# Patient Record
Sex: Female | Born: 1935 | Race: White | Hispanic: No | Smoking: Never smoker
Health system: Southern US, Community
[De-identification: ages and names within clinical notes are randomized; demographics above are authoritative.]

## PROBLEM LIST (undated history)

## (undated) DIAGNOSIS — S82143A Displaced bicondylar fracture of unspecified tibia, initial encounter for closed fracture: Secondary | ICD-10-CM

## (undated) DIAGNOSIS — I639 Cerebral infarction, unspecified: Secondary | ICD-10-CM

## (undated) DIAGNOSIS — I10 Essential (primary) hypertension: Secondary | ICD-10-CM

## (undated) DIAGNOSIS — R609 Edema, unspecified: Secondary | ICD-10-CM

## (undated) DIAGNOSIS — J45909 Unspecified asthma, uncomplicated: Secondary | ICD-10-CM

## (undated) DIAGNOSIS — I739 Peripheral vascular disease, unspecified: Secondary | ICD-10-CM

## (undated) DIAGNOSIS — I509 Heart failure, unspecified: Secondary | ICD-10-CM

## (undated) DIAGNOSIS — N189 Chronic kidney disease, unspecified: Secondary | ICD-10-CM

## (undated) HISTORY — PX: APPENDECTOMY: SHX54

## (undated) HISTORY — PX: FOOT SURGERY: SHX648

## (undated) HISTORY — PX: SHOULDER SURGERY: SHX246

---

## 2005-12-04 ENCOUNTER — Ambulatory Visit: Payer: Self-pay | Admitting: Internal Medicine

## 2006-12-07 ENCOUNTER — Ambulatory Visit: Payer: Self-pay | Admitting: Nurse Practitioner

## 2008-02-07 ENCOUNTER — Ambulatory Visit: Payer: Self-pay | Admitting: Internal Medicine

## 2008-10-10 ENCOUNTER — Inpatient Hospital Stay: Payer: Self-pay | Admitting: Vascular Surgery

## 2008-10-10 ENCOUNTER — Ambulatory Visit: Payer: Self-pay | Admitting: Internal Medicine

## 2009-07-16 ENCOUNTER — Ambulatory Visit: Payer: Self-pay | Admitting: Internal Medicine

## 2011-07-18 ENCOUNTER — Ambulatory Visit: Payer: Self-pay | Admitting: Internal Medicine

## 2013-01-26 ENCOUNTER — Ambulatory Visit: Payer: Self-pay | Admitting: Internal Medicine

## 2014-01-13 ENCOUNTER — Ambulatory Visit: Payer: Self-pay | Admitting: Family Medicine

## 2017-01-18 ENCOUNTER — Ambulatory Visit
Admission: EM | Admit: 2017-01-18 | Discharge: 2017-01-18 | Disposition: A | Discharge status: Home / Self Care | Payer: Medicare HMO | Attending: Family Medicine | Admitting: Family Medicine

## 2017-01-18 ENCOUNTER — Encounter: Payer: Self-pay | Admitting: Emergency Medicine

## 2017-01-18 DIAGNOSIS — T50905A Adverse effect of unspecified drugs, medicaments and biological substances, initial encounter: Secondary | ICD-10-CM

## 2017-01-18 DIAGNOSIS — L509 Urticaria, unspecified: Secondary | ICD-10-CM

## 2017-01-18 HISTORY — DX: Displaced bicondylar fracture of unspecified tibia, initial encounter for closed fracture: S82.143A

## 2017-01-18 HISTORY — DX: Peripheral vascular disease, unspecified: I73.9

## 2017-01-18 HISTORY — DX: Cerebral infarction, unspecified: I63.9

## 2017-01-18 HISTORY — DX: Heart failure, unspecified: I50.9

## 2017-01-18 HISTORY — DX: Unspecified asthma, uncomplicated: J45.909

## 2017-01-18 HISTORY — DX: Chronic kidney disease, unspecified: N18.9

## 2017-01-18 HISTORY — DX: Essential (primary) hypertension: I10

## 2017-01-18 HISTORY — DX: Edema, unspecified: R60.9

## 2017-01-18 MED ORDER — PREDNISONE 20 MG PO TABS
40.0000 mg | ORAL_TABLET | Freq: Once | ORAL | Status: AC
  Administered 2017-01-18: 40 mg via ORAL

## 2017-01-18 MED ORDER — PREDNISONE 20 MG PO TABS: ORAL_TABLET | ORAL | 0 refills | Status: DC

## 2017-01-18 MED ORDER — FAMOTIDINE 20 MG PO TABS: 20.0000 mg | ORAL_TABLET | Freq: Two times a day (BID) | ORAL | 0 refills | Status: DC

## 2017-01-18 NOTE — Discharge Instructions (Signed)
Take medication as prescribed. Rest. Drink plenty of fluids. Monitor closely.   Follow up with your primary care physician for follow up. Return to Urgent care as needed. For any lip, tongue or oral swelling, shortness of breath, chest discomfort, or worsening complaints proceed directly to the Emergency room.

## 2017-01-18 NOTE — ED Triage Notes (Addendum)
Per patient was seen at her doctor for an ear infection on 01/05/2017. Patient stated was given Rx. Amoxicillin 875 mg , Prednisolone , and Flonase. Patient started to have a reaction to the amoxicillin after taking she notice  rash all over her body. Per patient stop taking and went back to see her doctor x 3 days ago (01/15/17) and was given benadryl 25 mg. Patient stated rash getting worse.

## 2017-01-18 NOTE — ED Provider Notes (Signed)
[facility] ____________________________________________  Time seen: Approximately 2:46 PM  I have reviewed the triage vital signs and the nursing notes.   HISTORY  Chief Complaint Medication Reaction  HPI Heather Ellison is a 83 y.o. female presenting for evaluation of itchy rash that is all over. Patient states she was recently treated with amoxicillin, Flonase and methylprednisone for sinus congestion and right otitis media. Patient states that she completed the methylprednisone without any issues. Patient states that at 7 or 8 days into the antibiotic she began to have it itchy rash all over. Patient states the rash started with one red mark on her stomach that quickly spread elsewhere. States that she is still having a few new areas, up, but states overall the rash isn't going away. States rash is very itchy. States that she was seen by her doctor's office this past Thursday and was told to take Benadryl. States she's been taking Benadryl that helps with the itching the rash is still present. Denies any other changes in foods, medicines, lotions, detergents or other contacts. States that she has taken steroid medication as well as Flonase in the past without any complications. Denies previous known amoxicillin allergy. Denies any lip, tongue, oral swelling, shortness of breath, difficulty swallowing or chest discomfort. Reports his continued to eat and drink well. Reports is continue to remain active. Denies any insect bites, tick bite or tick attachment. Denies any other known trigger. States has not been trying any other over-the-counter medications for the same complaints. States taken Benadryl 1 pill 3-4 times a day. Denies any other known triggers. Denies any other aggravating or alleviating factors. States has stopped the amoxicillin and Flonase since rash onset.  Denies chest pain, shortness of breath, abdominal pain, dysuria, fatigue, extremity pain, extremity swelling. Denies  recent sickness. Denies recent antibiotic use.  Denies cardiac history. Denies renal insufficiency.  Verner Mould, MD: PCP   medical history on file HTN  There are no active problems to display for this patient.   Past Surgical History:  Procedure Laterality Date  . APPENDECTOMY    . FOOT SURGERY    . SHOULDER SURGERY       No current facility-administered medications for this encounter.   Current Outpatient Prescriptions:  .  amLODipine (NORVASC) 5 MG tablet, Take 5 mg by mouth daily., Disp: , Rfl:  .  Cholecalciferol 2000 units CAPS, Take by mouth., Disp: , Rfl:  .  fluticasone (FLONASE) 50 MCG/ACT nasal spray, Place into both nostrils daily., Disp: , Rfl:  .  gabapentin (NEURONTIN) 300 MG capsule, Take 300 mg by mouth 3 (three) times daily., Disp: , Rfl:  .  lisinopril (PRINIVIL,ZESTRIL) 40 MG tablet, Take 40 mg by mouth daily., Disp: , Rfl:  .  metoprolol succinate (TOPROL-XL) 50 MG 24 hr tablet, Take 50 mg by mouth daily. Take with or immediately following a meal., Disp: , Rfl:  .  torsemide (DEMADEX) 20 MG tablet, Take 20 mg by mouth daily., Disp: , Rfl:  .  vitamin B-12 (CYANOCOBALAMIN) 1000 MCG tablet, Take 1,000 mcg by mouth daily., Disp: , Rfl:  .  famotidine (PEPCID) 20 MG tablet, Take 1 tablet (20 mg total) by mouth 2 (two) times daily., Disp: 10 tablet, Rfl: 0 .  predniSONE (DELTASONE) 20 MG tablet, Take 40 mg orally for 2 days, then 20 mg orally for 2 days, then 10 mg orally for 2 days., Disp: 7 tablet, Rfl: 0  Allergies Amoxicillin  No family history on file.  Social History Social History  Substance Use Topics  . Smoking status: Former Games developer  . Smokeless tobacco: Never Used  . Alcohol use No    Review of Systems Constitutional: No fever/chills Eyes: No visual changes. ENT: No sore throat. Cardiovascular: Denies chest pain. Respiratory: Denies shortness of breath. Gastrointestinal: No abdominal pain.  No nausea, no vomiting.  Musculoskeletal:  Negative for back pain. Skin: positive for rash. Neurological: Negative for headaches, focal weakness or numbness.   ____________________________________________   PHYSICAL EXAM:  VITAL SIGNS: ED Triage Vitals  Enc Vitals Group     BP 01/18/17 1439 (!) 138/50     Pulse Rate 01/18/17 1439 83     Resp 01/18/17 1439 16     Temp 01/18/17 1439 98.1 F (36.7 C)     Temp Source 01/18/17 1439 Oral     SpO2 01/18/17 1439 98 %     Weight 01/18/17 1442 215 lb (97.5 kg)     Height --      Head Circumference --      Peak Flow --      Pain Score 01/18/17 1443 0     Pain Loc --      Pain Edu? --      Excl. in GC? --     Constitutional: Alert and oriented. Well appearing and in no acute distress. ENT      Head: Normocephalic and atraumatic.      Mouth/Throat: Mucous membranes are moist.Oropharynx non-erythematous. No lip, tongue or oral swelling noted. Speech clear.  Neck: No stridor. Supple without meningismus.  Hematological/Lymphatic/Immunilogical: No cervical lymphadenopathy. Cardiovascular: Normal rate, regular rhythm. Grossly normal heart sounds.  Good peripheral circulation. Respiratory: Normal respiratory effort without tachypnea nor retractions. Breath sounds are clear and equal bilaterally. No wheezes, rales, rhonchi. Gastrointestinal: Soft and nontender.  Musculoskeletal. No midline cervical, thoracic or lumbar tenderness to palpation.       Right lower leg:  No tenderness or edema.      Left lower leg:  No tenderness or edema.  Neurologic:  Normal speech and language. No gross focal neurologic deficits are appreciated. Speech is normal.  Skin:  Skin is warm, dry and intact.except: Diffuse urticaria, nontender, no drainage, skin intact.  Psychiatric: Mood and affect are normal. Speech and behavior are normal. Patient exhibits appropriate insight and judgment   ___________________________________________   LABS (all labs ordered are listed, but only abnormal results are  displayed)  Labs Reviewed - No data to display ____________________________________________   PROCEDURES Procedures     INITIAL IMPRESSION / ASSESSMENT AND PLAN / ED COURSE  Pertinent labs & imaging results that were available during my care of the patient were reviewed by me and considered in my medical decision making (see chart for details).  Well-appearing patient. No acute distress. Diffuse urticaria. Discussed with patient, suspect medication reaction from amoxicillin. Patient states Benadryl is not resolving the rash that she has been taking over the last 2 days. Denies cardiac history or renal insufficiency. States not diabetic. Will treat patient with oral prednisone taper, Pepcid and continue home Benadryl. Discussed very strict follow-up and return parameters, including proceeding to emergency room for any lip or oral swelling, difficulty swallowing, shortness of breath or chest discomfort. Encourage close PCP follow-up, follow-up this week. Patient states that her pharmacy is closed today, 40 mg oral prednisone given once in urgent care. Avoid trigger. Discussed indication, risks and benefits of medications with patient.  Discussed follow up with Primary care physician this week.  Discussed follow up and return parameters including no resolution or any worsening concerns. Patient verbalized understanding and agreed to plan.   ____________________________________________   FINAL CLINICAL IMPRESSION(S) / ED DIAGNOSES  Final diagnoses:  Urticarial rash  Medication reaction, initial encounter     Discharge Medication List as of 01/18/2017  3:11 PM    START taking these medications   Details  famotidine (PEPCID) 20 MG tablet Take 1 tablet (20 mg total) by mouth 2 (two) times daily., Starting Sun 01/18/2017, Until Fri 01/23/2017, Normal    predniSONE (DELTASONE) 20 MG tablet Take 40 mg orally for 2 days, then 20 mg orally for 2 days, then 10 mg orally for 2 days., Normal          Note: This dictation was prepared with Dragon dictation along with smaller phrase technology. Any transcriptional errors that result from this process are unintentional.         Renford Dills, NP 01/18/17 [phone]

## 2018-05-04 ENCOUNTER — Other Ambulatory Visit: Payer: Self-pay

## 2018-05-04 ENCOUNTER — Ambulatory Visit
Admission: EM | Admit: 2018-05-04 | Discharge: 2018-05-04 | Disposition: A | Discharge status: Home / Self Care | Payer: Medicare HMO | Attending: Family Medicine | Admitting: Family Medicine

## 2018-05-04 ENCOUNTER — Ambulatory Visit (INDEPENDENT_AMBULATORY_CARE_PROVIDER_SITE_OTHER): Payer: Medicare HMO

## 2018-05-04 ENCOUNTER — Encounter: Payer: Self-pay | Admitting: Emergency Medicine

## 2018-05-04 DIAGNOSIS — I10 Essential (primary) hypertension: Secondary | ICD-10-CM | POA: Diagnosis not present

## 2018-05-04 DIAGNOSIS — L84 Corns and callosities: Secondary | ICD-10-CM | POA: Insufficient documentation

## 2018-05-04 DIAGNOSIS — M79672 Pain in left foot: Secondary | ICD-10-CM | POA: Diagnosis not present

## 2018-05-04 NOTE — ED Triage Notes (Signed)
Patient in today c/o left foot pain around the arch x 1 month. No injury noted.

## 2018-05-04 NOTE — ED Triage Notes (Signed)
Appointment made with Dr. Ether [place] for 05/10/18 @1 :15pm.

## 2018-05-04 NOTE — Discharge Instructions (Signed)
Use postoperative shoe.  Follow-up with podiatry.  Return to urgent care as needed.

## 2018-05-04 NOTE — ED Provider Notes (Signed)
[facility] ____________________________________________  Time seen: Approximately 11:48 AM  I have reviewed the triage vital signs and the nursing notes.   HISTORY  Chief Complaint Foot Pain (left)   HPI Heather Ellison is Ellison 84 y.o. female seen for evaluation of left foot pain to the arch of her foot present for approximately 1 month.  Denies any fall, injury or known trigger.  Patient does report however she has been very busy over the last few months as her sons have been in hospitals.  States that she has had increased walking compared to normal.  Denies abrupt onset.  States that she does not believe her shoes are rubbing this area.  Denies any redness or drainage.  No fevers.  Denies any pain radiation, paresthesias or other pain.  Reports otherwise doing well denies other complaints.  No chest pain shortness of breath.  No alleviating measures attempted.  States pain is worse with direct palpation.  Currently mild pain.  Verner MouldFowler, Heather Ellison, Heather Ellison: PCP    Past Medical History:  Diagnosis Date  . Asthma   . CKD (chronic kidney disease)   . Heart failure (HCC)   . Hypertension   . Peripheral arterial disease (HCC)   . Peripheral edema   . Stroke (HCC)   . Tibial plateau fracture     There are no active problems to display for this patient.   Past Surgical History:  Procedure Laterality Date  . APPENDECTOMY    . FOOT SURGERY    . SHOULDER SURGERY       No current facility-administered medications for this encounter.   Current Outpatient Medications:  .  amLODipine (NORVASC) 5 MG tablet, Take 5 mg by mouth daily., Disp: , Rfl:  .  lisinopril (PRINIVIL,ZESTRIL) 40 MG tablet, Take 40 mg by mouth daily., Disp: , Rfl:  .  metoprolol succinate (TOPROL-XL) 50 MG 24 hr tablet, Take 50 mg by mouth daily. Take with or immediately following Ellison meal., Disp: , Rfl:  .  vitamin B-12 (CYANOCOBALAMIN) 1000 MCG tablet, Take 1,000 mcg by mouth daily., Disp: , Rfl:    Allergies Amoxicillin  Family History  Problem Relation Age of Onset  . Heart attack Mother   . Lung disease Father     Social History Social History   Tobacco Use  . Smoking status: Never Smoker  . Smokeless tobacco: Never Used  Substance Use Topics  . Alcohol use: No  . Drug use: No    Review of Systems Constitutional: No fever Cardiovascular: Denies chest pain. Respiratory: Denies shortness of breath. Gastrointestinal: No abdominal pain.  Musculoskeletal: As above.  Skin: Negative for rash.  ____________________________________________   PHYSICAL EXAM:  VITAL SIGNS: ED Triage Vitals  Enc Vitals Group     BP 05/04/18 1040 (!) 162/95     Pulse Rate 05/04/18 1040 79     Resp 05/04/18 1040 16     Temp 05/04/18 1040 98.3 F (36.8 C)     Temp Source 05/04/18 1040 Oral     SpO2 05/04/18 1040 97 %     Weight 05/04/18 1040 197 lb (89.4 kg)     Height 05/04/18 1040 5\' 4"  (1.626 m)     Head Circumference --      Peak Flow --      Pain Score 05/04/18 1038 8     Pain Loc --      Pain Edu? --      Excl. in GC? --  Constitutional: Alert and oriented. Well appearing and in no acute distress. ENT      Head: Normocephalic and atraumatic. Cardiovascular: Normal rate, regular rhythm. Grossly normal heart sounds.  Good peripheral circulation. Respiratory: Normal respiratory effort without tachypnea nor retractions. Breath sounds are clear and equal bilaterally. No wheezes, rales, rhonchi. Musculoskeletal: Bilateral pedal pulses equal and easily palpated. Except: Left plantar aspect mid arch of foot area of mild localized edema callused area with appearance of central core, mild tenderness to direct palpation, no surrounding tenderness, no point bony tenderness, no erythema, no fluctuance or drainage, left foot otherwise nontender, and left foot with full range of motion present. Neurologic:  Normal speech and language. Speech is normal.  Skin:  Skin is warm, dry. As  above. Psychiatric: Mood and affect are normal. Speech and behavior are normal. Patient exhibits appropriate insight and judgment   ___________________________________________   LABS (all labs ordered are listed, but only abnormal results are displayed)  Labs Reviewed - No data to display  RADIOLOGY  Dg Foot Complete Left  Result Date: 05/04/2018 CLINICAL DATA:  Painful area under medial instep of arch. Rule out foreign body. EXAM: LEFT FOOT - COMPLETE 3+ VIEW COMPARISON:  None. FINDINGS: Mild degenerative changes and early hallux valgus deformity at the 1st MTP joint. Mild degenerative changes in the tarsal region. No acute bony abnormality. Specifically, no fracture, subluxation, or dislocation. No radiopaque foreign body. IMPRESSION: No acute bony abnormality or radiopaque foreign body. Degenerative changes as above. Electronically Signed   By: Charlett Nose M.D.   On: 05/04/2018 11:13   ____________________________________________   PROCEDURES Procedures   INITIAL IMPRESSION / ASSESSMENT AND PLAN / ED COURSE  Pertinent labs & imaging results that were available during my care of the patient were reviewed by me and considered in my medical decision making (see chart for details).  Well-appearing patient.  No acute distress.  Left foot pain for 1 month.  Left foot x-ray as above per radiologist degenerative changes, no acute bony abnormality or radiopaque foreign body.  Callus area to left foot arch, area appears consistent with Ellison corn.  Discussed with patient using postoperative shoe as it appears that her shoe is rubbing against this area.  Nursing staff called and made follow-up appointment with podiatry for patient for this coming Monday at 1:15 PM.  Patient verbalized understanding and agreed with this plan.  Supportive care.  Discussed follow up and return parameters including no resolution or any worsening concerns. Patient verbalized understanding and agreed to plan.    ____________________________________________   FINAL CLINICAL IMPRESSION(S) / ED DIAGNOSES  Final diagnoses:  Callus of foot  Left foot pain     ED Discharge Orders    None       Note: This dictation was prepared with Dragon dictation along with smaller phrase technology. Any transcriptional errors that result from this process are unintentional.         Renford Dills, NP 05/04/18 1153

## 2018-06-24 ENCOUNTER — Encounter: Payer: Self-pay | Admitting: Emergency Medicine

## 2018-06-24 ENCOUNTER — Other Ambulatory Visit: Payer: Self-pay

## 2018-06-24 ENCOUNTER — Ambulatory Visit
Admission: EM | Admit: 2018-06-24 | Discharge: 2018-06-24 | Disposition: A | Discharge status: Home / Self Care | Payer: Medicare HMO | Attending: Emergency Medicine | Admitting: Emergency Medicine

## 2018-06-24 ENCOUNTER — Ambulatory Visit (INDEPENDENT_AMBULATORY_CARE_PROVIDER_SITE_OTHER): Payer: Medicare HMO

## 2018-06-24 DIAGNOSIS — J0110 Acute frontal sinusitis, unspecified: Secondary | ICD-10-CM | POA: Diagnosis not present

## 2018-06-24 DIAGNOSIS — R062 Wheezing: Secondary | ICD-10-CM

## 2018-06-24 DIAGNOSIS — R05 Cough: Secondary | ICD-10-CM | POA: Diagnosis not present

## 2018-06-24 DIAGNOSIS — R059 Cough, unspecified: Secondary | ICD-10-CM

## 2018-06-24 LAB — BASIC METABOLIC PANEL
Anion gap: 9 (ref 5–15)
BUN: 17 mg/dL (ref 8–23)
CHLORIDE: 107 mmol/L (ref 98–111)
CO2: 24 mmol/L (ref 22–32)
Calcium: 9.9 mg/dL (ref 8.9–10.3)
Creatinine, Ser: 0.69 mg/dL (ref 0.44–1.00)
Glucose, Bld: 108 mg/dL — ABNORMAL HIGH (ref 70–99)
POTASSIUM: 3.4 mmol/L — AB (ref 3.5–5.1)
SODIUM: 140 mmol/L (ref 135–145)

## 2018-06-24 LAB — CBC WITH DIFFERENTIAL/PLATELET
ABS IMMATURE GRANULOCYTES: 0.03 10*3/uL (ref 0.00–0.07)
BASOS ABS: 0 10*3/uL (ref 0.0–0.1)
Basophils Relative: 0 %
Eosinophils Absolute: 0.1 10*3/uL (ref 0.0–0.5)
Eosinophils Relative: 2 %
HCT: 42.4 % (ref 36.0–46.0)
HEMOGLOBIN: 14.3 g/dL (ref 12.0–15.0)
Immature Granulocytes: 0 %
LYMPHS PCT: 27 %
Lymphs Abs: 1.9 10*3/uL (ref 0.7–4.0)
MCH: 30.4 pg (ref 26.0–34.0)
MCHC: 33.7 g/dL (ref 30.0–36.0)
MCV: 90.2 fL (ref 80.0–100.0)
MONO ABS: 1 10*3/uL (ref 0.1–1.0)
Monocytes Relative: 14 %
NEUTROS ABS: 4 10*3/uL (ref 1.7–7.7)
NEUTROS PCT: 57 %
NRBC: 0 % (ref 0.0–0.2)
Platelets: 192 10*3/uL (ref 150–400)
RBC: 4.7 MIL/uL (ref 3.87–5.11)
RDW: 13.3 % (ref 11.5–15.5)
WBC: 7 10*3/uL (ref 4.0–10.5)

## 2018-06-24 MED ORDER — IPRATROPIUM-ALBUTEROL 0.5-2.5 (3) MG/3ML IN SOLN
3.0000 mL | Freq: Once | RESPIRATORY_TRACT | Status: AC
  Administered 2018-06-24: 3 mL via RESPIRATORY_TRACT

## 2018-06-24 MED ORDER — AEROCHAMBER PLUS MISC: 2 refills | Status: AC

## 2018-06-24 MED ORDER — FLUTICASONE PROPIONATE 50 MCG/ACT NA SUSP: 2.0000 | Freq: Every day | NASAL | 0 refills | Status: AC

## 2018-06-24 MED ORDER — ALBUTEROL SULFATE HFA 108 (90 BASE) MCG/ACT IN AERS: 1.0000 | INHALATION_SPRAY | Freq: Four times a day (QID) | RESPIRATORY_TRACT | 0 refills | Status: AC | PRN

## 2018-06-24 MED ORDER — DOXYCYCLINE HYCLATE 100 MG PO CAPS: 100.0000 mg | ORAL_CAPSULE | Freq: Two times a day (BID) | ORAL | 0 refills | Status: AC

## 2018-06-24 NOTE — ED Triage Notes (Signed)
Patient in today c/o cough and headache x 2 weeks. Patient states she had had low grade fever, but doesn't know what it was. Patient has tried OTC Cloracedin.

## 2018-06-24 NOTE — ED Provider Notes (Signed)
HPI  SUBJECTIVE:  Heather Ellison is a 85 y.o. female who presents with 2 weeks of "phlegm in my throat" and a cough.  States the phlegm is clear/white.  She states that she is getting better overall.  No chest pain, wheezing, shortness of breath, dyspnea on exertion.  No lower extremity edema, unintentional weight gain, nocturia, PND, orthopnea, abdominal pain, GERD symptoms.  She has tried Coricidin without improvement of her symptoms.  No aggravating factors. she also reports daily gradual onset, sinus headache that is present only at night over the past 2 weeks.  Reports a sore throat and fever T-max 101-week ago, none since.  She reports rhinorrhea.  No nasal congestion, postnasal drip, sinus pain or pressure, body aches.  No upper dental pain, facial swelling, neck stiffness, phonophobia.  No rash.  She has been taking Tylenol at night and Coricidin with improvement in her symptoms.  Her headache is worse when it gets dark. it Is not associated with bending forward or lying down.  No antipyretic in the past 4 to 6 hours.  Per chart review patient has a history of asthma, CHF, stroke, hypertension, peripheral arterial disease, chronic kidney disease.  Patient denies a history of asthma, CHF, stroke.  No history of diabetes.  PMD, she states that she is in the process of changing providers, but has a follow-up plan in place.  Past Medical History:  Diagnosis Date  . Asthma   . CKD (chronic kidney disease)   . Heart failure (HCC)   . Hypertension   . Peripheral arterial disease (HCC)   . Peripheral edema   . Stroke (HCC)   . Tibial plateau fracture     Past Surgical History:  Procedure Laterality Date  . APPENDECTOMY    . FOOT SURGERY    . SHOULDER SURGERY      Family History  Problem Relation Age of Onset  . Heart attack Mother   . Lung disease Father     Social History   Tobacco Use  . Smoking status: Never Smoker  . Smokeless tobacco: Never Used  Substance Use Topics  .  Alcohol use: No  . Drug use: No    No current facility-administered medications for this encounter.   Current Outpatient Medications:  .  amLODipine (NORVASC) 5 MG tablet, Take 5 mg by mouth daily., Disp: , Rfl:  .  lisinopril (PRINIVIL,ZESTRIL) 40 MG tablet, Take 40 mg by mouth daily., Disp: , Rfl:  .  metoprolol succinate (TOPROL-XL) 50 MG 24 hr tablet, Take 50 mg by mouth daily. Take with or immediately following a meal., Disp: , Rfl:  .  vitamin B-12 (CYANOCOBALAMIN) 1000 MCG tablet, Take 1,000 mcg by mouth daily., Disp: , Rfl:  .  albuterol (PROVENTIL HFA;VENTOLIN HFA) 108 (90 Base) MCG/ACT inhaler, Inhale 1-2 puffs into the lungs every 6 (six) hours as needed for wheezing or shortness of breath., Disp: 1 Inhaler, Rfl: 0 .  doxycycline (VIBRAMYCIN) 100 MG capsule, Take 1 capsule (100 mg total) by mouth 2 (two) times daily for 7 days., Disp: 14 capsule, Rfl: 0 .  fluticasone (FLONASE) 50 MCG/ACT nasal spray, Place 2 sprays into both nostrils daily., Disp: 16 g, Rfl: 0 .  Spacer/Aero-Holding Chambers (AEROCHAMBER PLUS) inhaler, Use as instructed, Disp: 1 each, Rfl: 2  Allergies  Allergen Reactions  . Amoxicillin Rash     ROS  As noted in HPI.   Physical Exam  BP (!) 149/99 (BP Location: Left Arm)   Pulse 80  Temp 98.7 F (37.1 C) (Oral)   Resp 16   Ht 5\' 4"  (1.626 m)   Wt 88.9 kg   SpO2 100%   BMI 33.64 kg/m   Constitutional: Well developed, well nourished, no acute distress Eyes:  EOMI, conjunctiva normal bilaterally HENT: Normocephalic, atraumatic,mucus membranes moist.  No nasal congestion.  Erythematous, swollen turbinates right side.  No maxillary, frontal sinus tenderness.  Positive cobblestoning.  No obvious postnasal drip. Respiratory: Normal inspiratory effort, wheezing, rhonchi right side. Cardiovascular: Normal rate, regular rhythm.  Positive systolic ejection murmur. GI: nondistended skin: No rash, skin intact Musculoskeletal: no deformities.  Calves  symmetric, nontender, trace edema bilaterally. Neurologic: Alert & oriented x 3, no focal neuro deficits Psychiatric: Speech and behavior appropriate   ED Course   Medications  ipratropium-albuterol (DUONEB) 0.5-2.5 (3) MG/3ML nebulizer solution 3 mL (3 mLs Nebulization Given 06/24/18 1445)    Orders Placed This Encounter  Procedures  . DG Chest 2 View    Standing Status:   Standing    Number of Occurrences:   1    Order Specific Question:   Reason for Exam (SYMPTOM  OR DIAGNOSIS REQUIRED)    Answer:   h/o asgthma CHF cough x 2 weeks r/o pulm edema PNA  . Basic metabolic panel    Standing Status:   Standing    Number of Occurrences:   1  . CBC with Differential    Standing Status:   Standing    Number of Occurrences:   1    No results found for this or any previous visit (from the past 24 hour(s)). Dg Chest 2 View  Result Date: 06/24/2018 CLINICAL DATA:  Cough for 2 weeks. EXAM: CHEST - 2 VIEW COMPARISON:  None FINDINGS: The cardiomediastinal silhouette is unremarkable. There is no evidence of focal airspace disease, pulmonary edema, suspicious pulmonary nodule/mass, pleural effusion, or pneumothorax. No acute bony abnormalities are identified. IMPRESSION: No active cardiopulmonary disease. Electronically Signed   By: Harmon Pier M.D.   On: 06/24/2018 14:46    ED Clinical Impression  Cough  Acute non-recurrent frontal sinusitis   ED Assessment/Plan  Suspect patient had a URI initially, now with a sinusitis plus or minus a mild asthma exacerbation. patient has wheezing, rhonchi on the right side.  Obtaining chest x-ray to evaluate for pneumonia, pulmonary edema.  Will give DuoNeb.  Also last labs available were 2018, will check a BMP and a CBC.  Also discussed murmur with patient, she states that she has never had this evaluated.  BMP normal.  Normal kidney function.  CBC normal.  Chest x-ray unremarkable.  Plan to treat as if this is a sinusitis with doxycycline, Flonase.   She is to start some saline nasal irrigation.  We will also send home with an albuterol inhaler with a spacer.  2 puffs every 4-6 hours as needed for coughing.  Follow-up with PMD in several days for reevaluation and for referral to cardiology for evaluation of the murmur.  She is to go to the ER if she gets worse  Reviewed imaging independently.  No acute pulmonary disease see radiology report for full details.  Patient, patient states that she feels better.  She has loud wheezing on the right side.  Rhonchi have resolved.  Plan as above.  Discussed labs, imaging, MDM, treatment plan, and plan for follow-up with patient. Discussed sn/sx that should prompt return to the ED. patient agrees with plan.   Meds ordered this encounter  Medications  .  ipratropium-albuterol (DUONEB) 0.5-2.5 (3) MG/3ML nebulizer solution 3 mL  . doxycycline (VIBRAMYCIN) 100 MG capsule    Sig: Take 1 capsule (100 mg total) by mouth 2 (two) times daily for 7 days.    Dispense:  14 capsule    Refill:  0  . fluticasone (FLONASE) 50 MCG/ACT nasal spray    Sig: Place 2 sprays into both nostrils daily.    Dispense:  16 g    Refill:  0  . albuterol (PROVENTIL HFA;VENTOLIN HFA) 108 (90 Base) MCG/ACT inhaler    Sig: Inhale 1-2 puffs into the lungs every 6 (six) hours as needed for wheezing or shortness of breath.    Dispense:  1 Inhaler    Refill:  0  . Spacer/Aero-Holding Chambers (AEROCHAMBER PLUS) inhaler    Sig: Use as instructed    Dispense:  1 each    Refill:  2    *This clinic note was created using Dragon dictation software. Therefore, there may be occasional mistakes despite careful proofreading.   ?   Domenick GongMortenson, Janani Chamber, MD 06/25/18 [phone]

## 2018-06-24 NOTE — Discharge Instructions (Addendum)
Finish the doxycycline which is the antibiotic.  Try the Flonase and some saline nasal irrigation with a Lloyd Huger med rinse and distilled water as often as you want.  This will help with the phlegm in your throat and your headaches.  You can continue Tylenol as 3 or 4 times a day as needed for pain.  Corcedrin as needed.  2 puffs from your albuterol inhaler every 4-6 hours for the next several days, then you may back off on it and use it as needed.

## 2018-11-24 DEATH — deceased

## 2019-11-14 IMAGING — CR DG FOOT COMPLETE 3+V*L*
3 series · 4 of 4 positions shown · non-contrast
Comparison: None.

CLINICAL DATA: Painful area under medial instep of arch. Rule out
foreign body.

EXAM:
LEFT FOOT - COMPLETE 3+ VIEW

[foot ap]
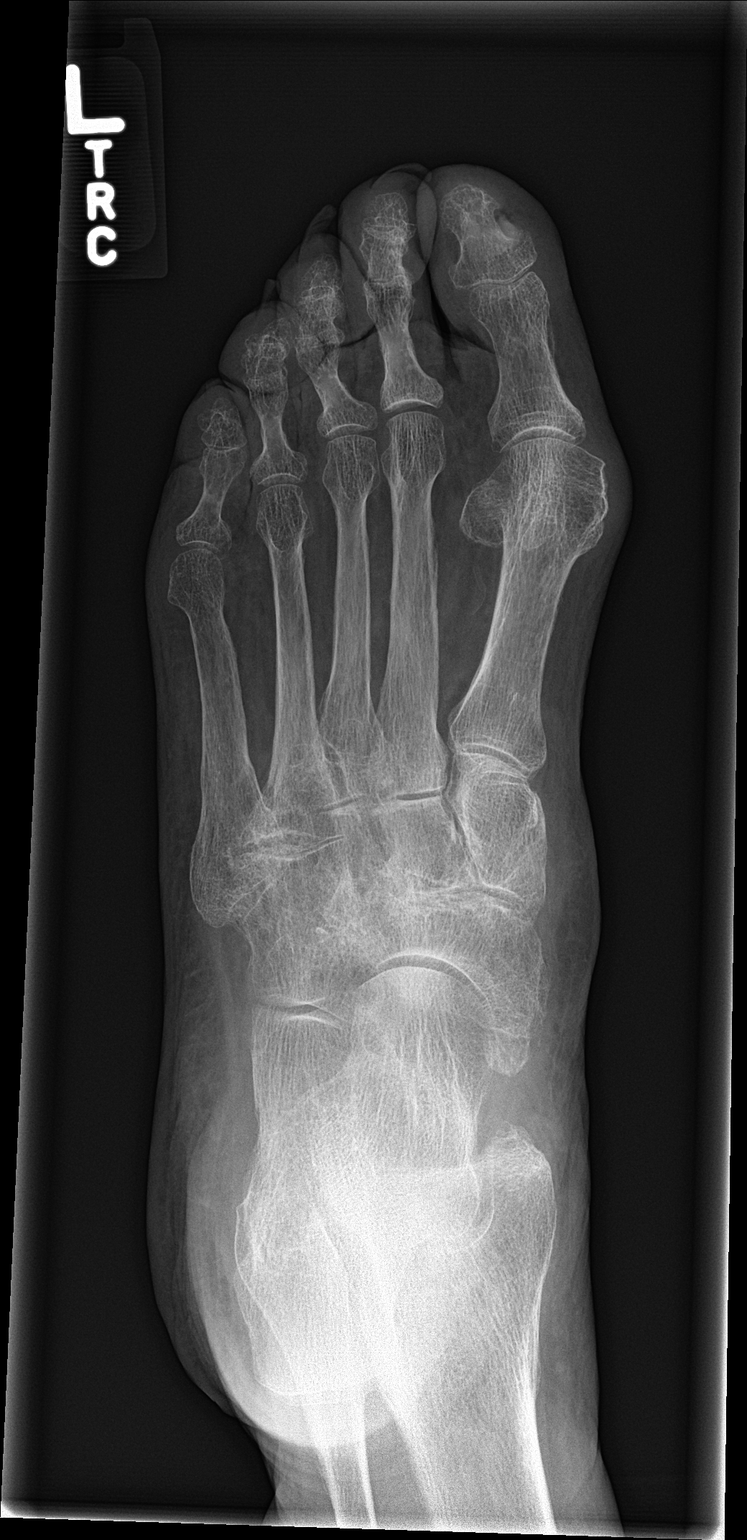

[foot obl]
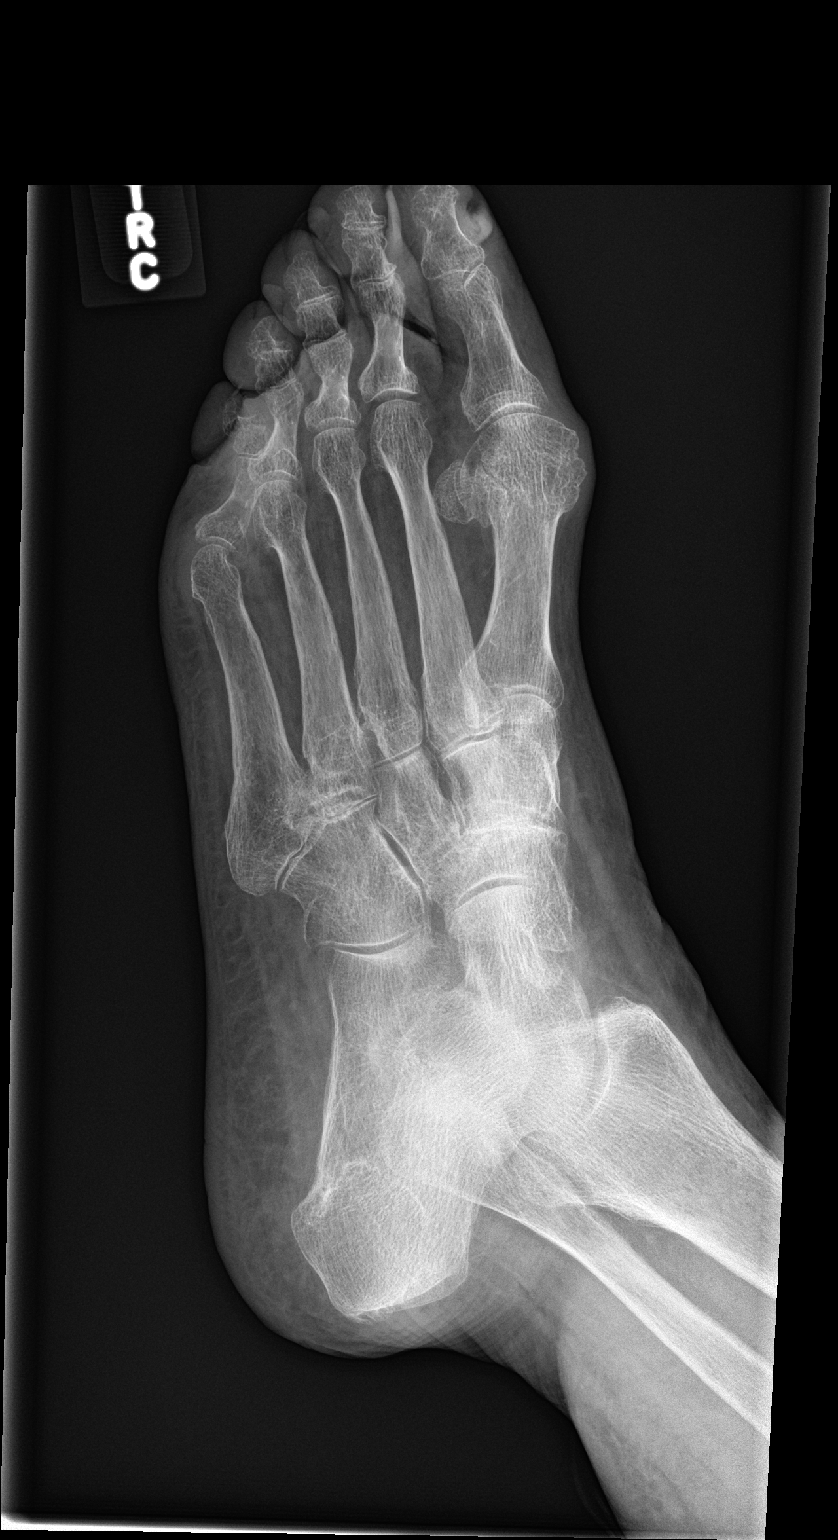

[Series 3: foot lat · 0.14mm/px · 2 of 2 slices shown]
[im 1/2]
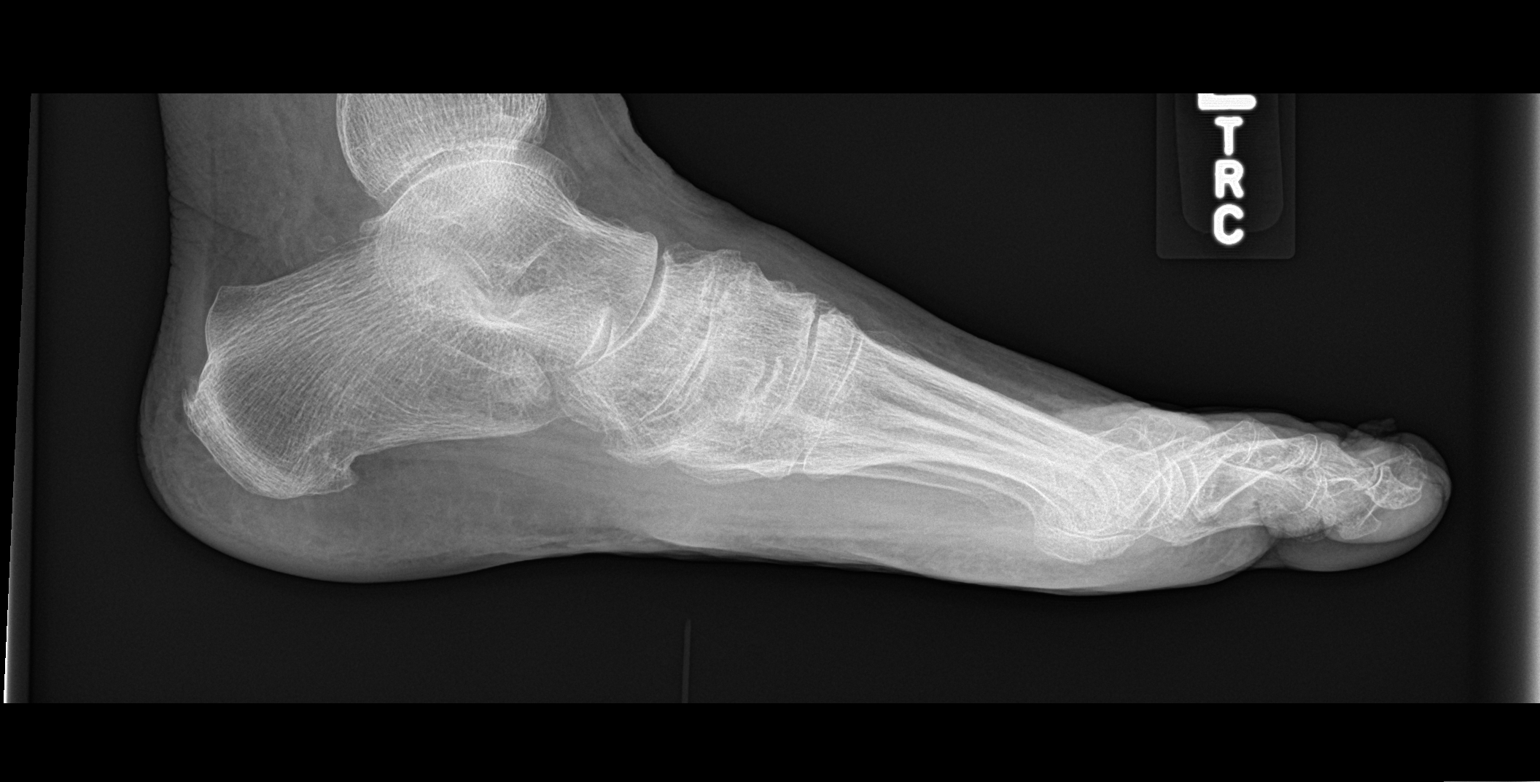
[im 2/2]
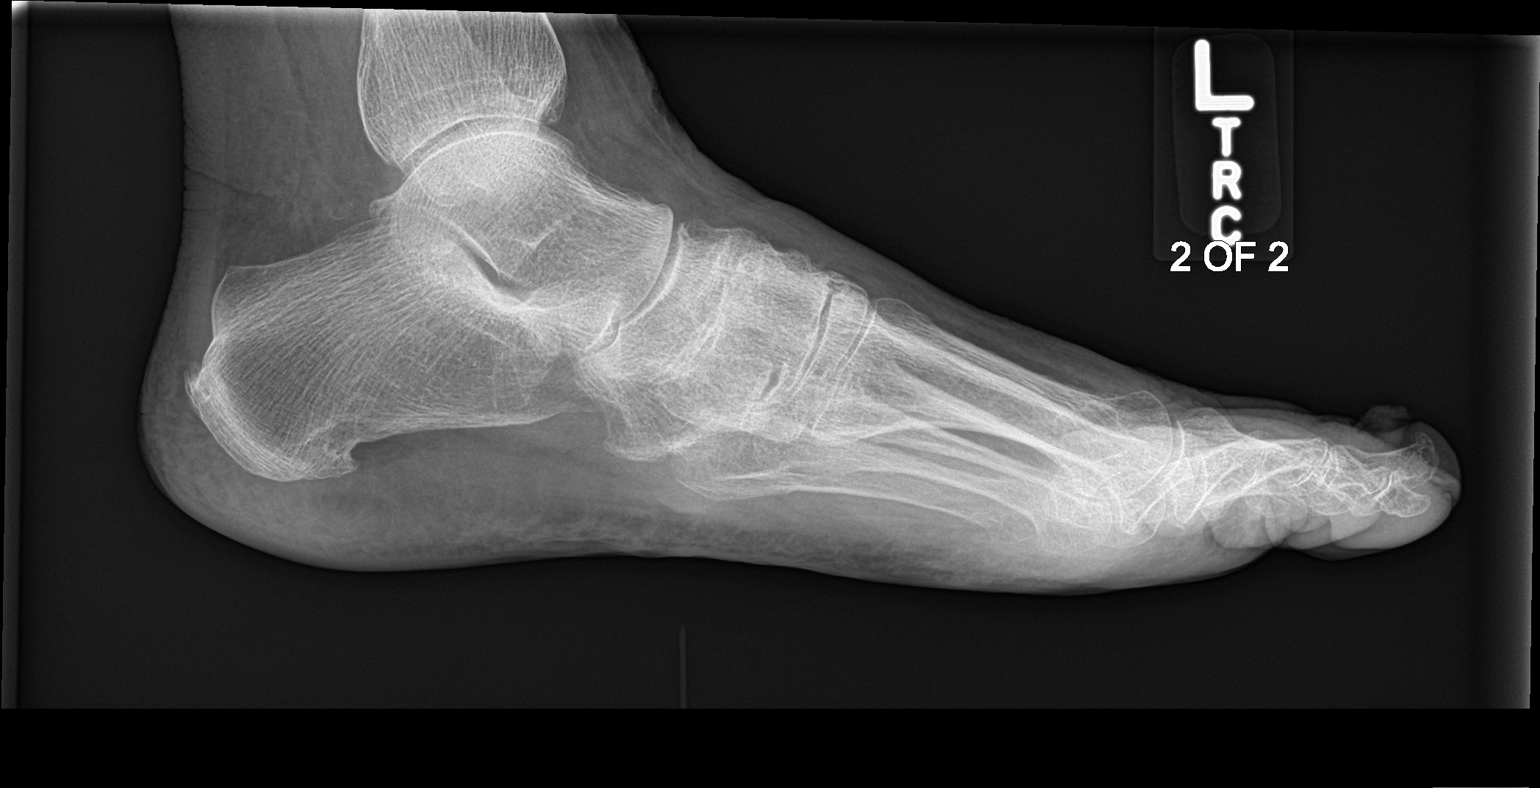

[4 of 4 positions shown; findings below may reference images not displayed]

FINDINGS: Mild degenerative changes and early hallux valgus deformity at the
1st MTP joint. Mild degenerative changes in the tarsal region. No
acute bony abnormality. Specifically, no fracture, subluxation, or
dislocation. No radiopaque foreign body.
IMPRESSION: No acute bony abnormality or radiopaque foreign body. Degenerative
changes as above.

## 2020-01-04 IMAGING — CR DG CHEST 2V
2 series · 2 of 2 positions shown · non-contrast
Comparison: None

CLINICAL DATA: Cough for 2 weeks.

EXAM:
CHEST - 2 VIEW

[chest pa]
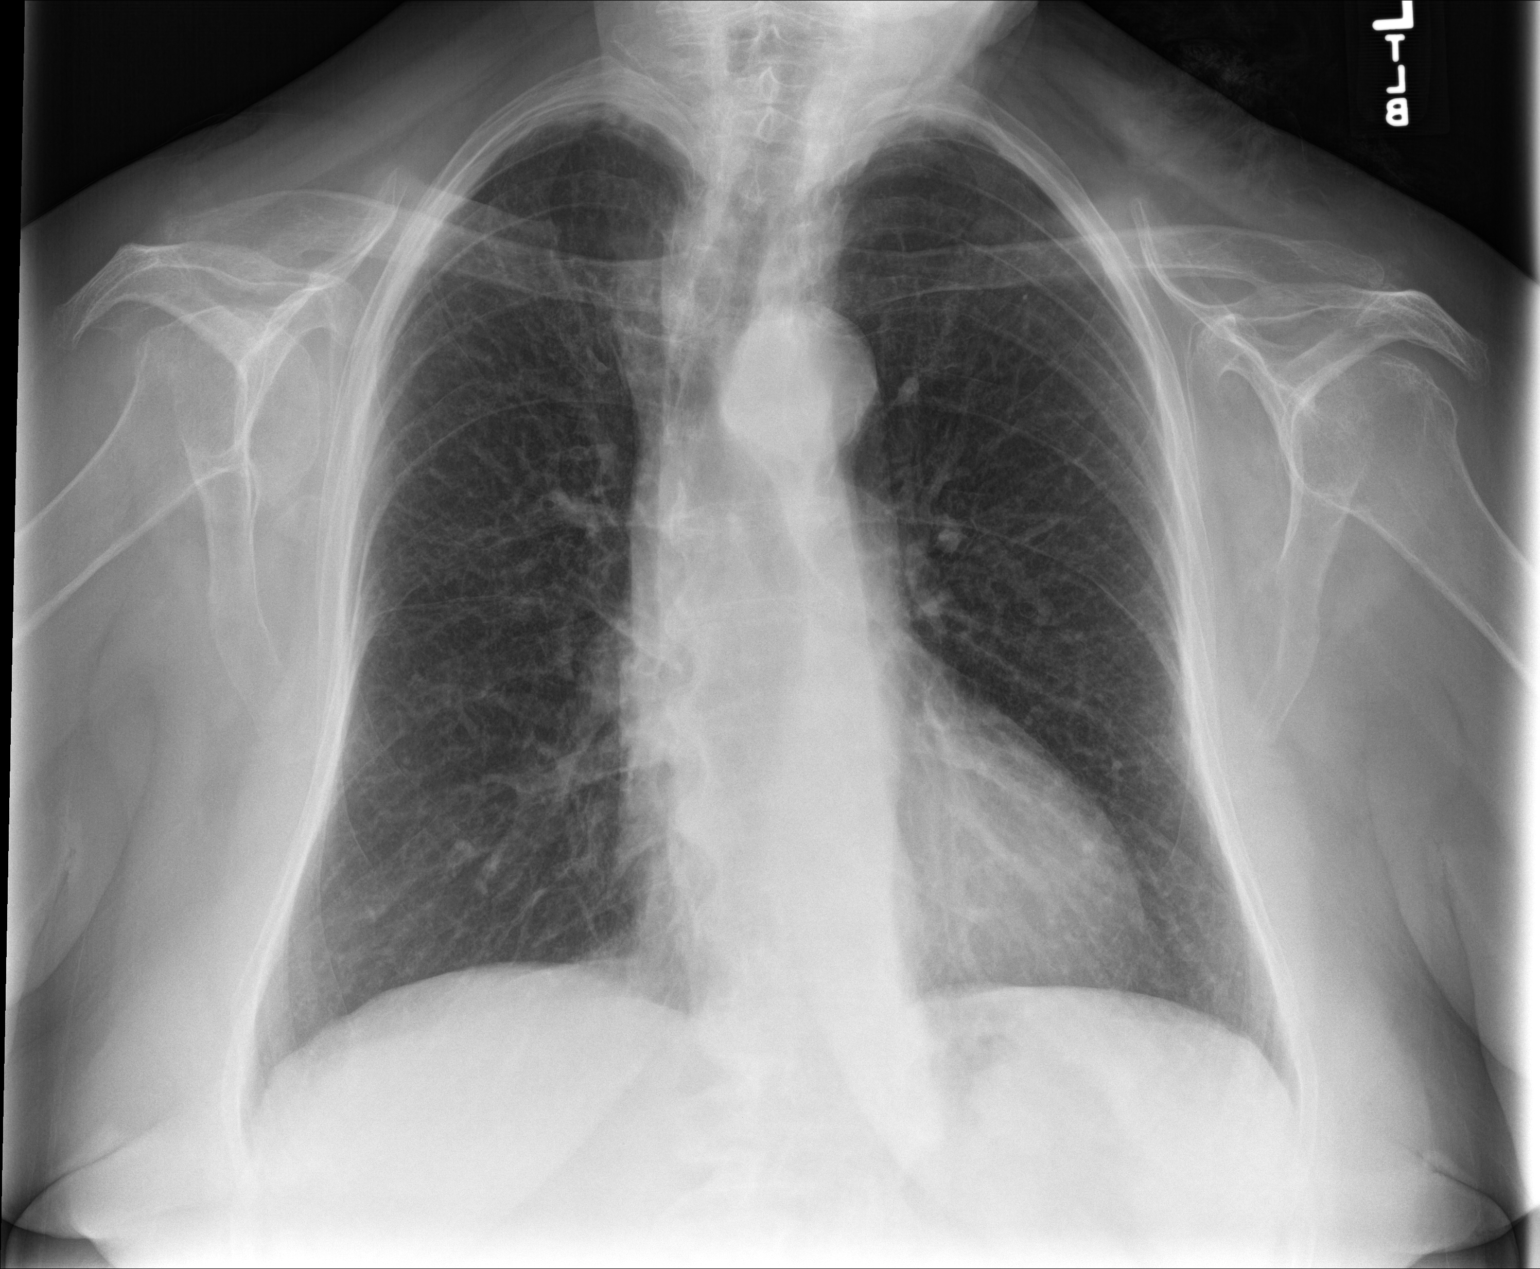

[chest lat]
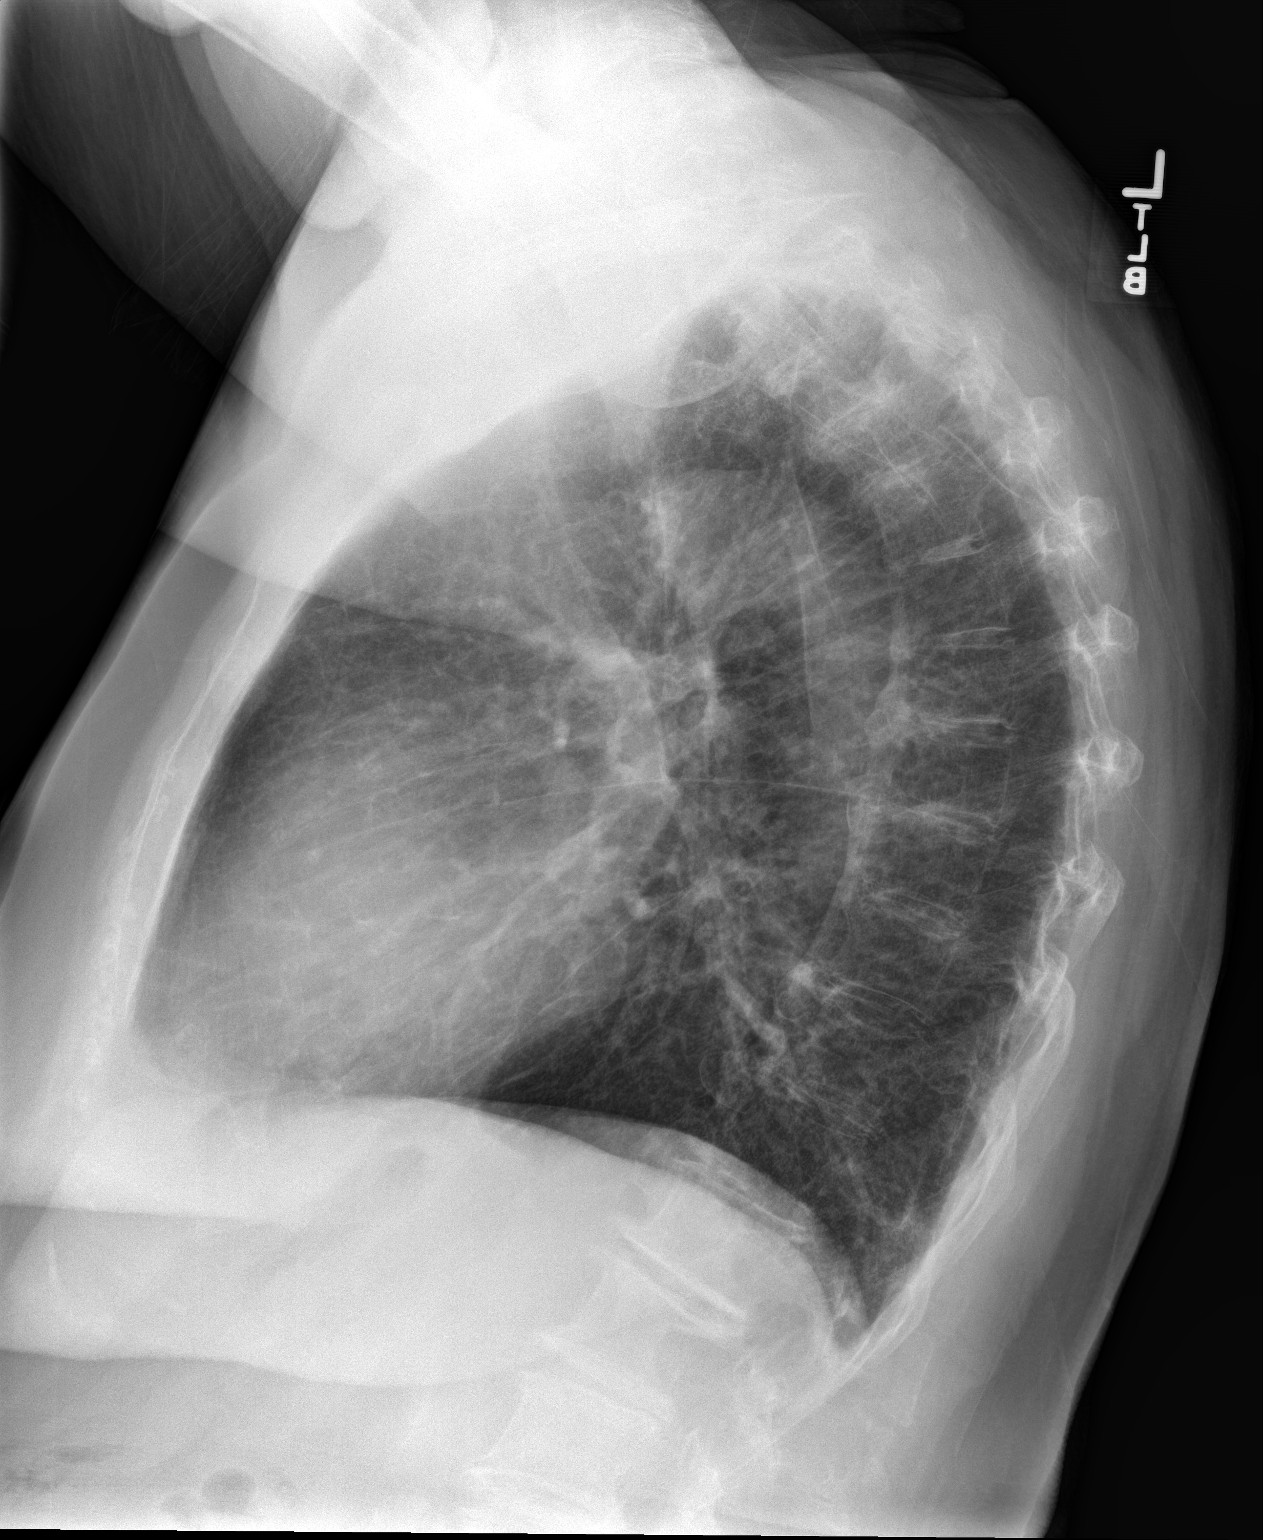

[2 of 2 positions shown; findings below may reference images not displayed]

FINDINGS: The cardiomediastinal silhouette is unremarkable.

There is no evidence of focal airspace disease, pulmonary edema,
suspicious pulmonary nodule/mass, pleural effusion, or pneumothorax.

No acute bony abnormalities are identified.
IMPRESSION: No active cardiopulmonary disease.
